# Patient Record
Sex: Male | Born: 2000 | Hispanic: No | Marital: Single | Smoking: Never smoker
Health system: Southern US, Community
[De-identification: ages and names within clinical notes are randomized; demographics above are authoritative.]

---
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
## 2020-07-20 DIAGNOSIS — L301 Dyshidrosis [pompholyx]: Secondary | ICD-10-CM | POA: Diagnosis not present

## 2021-02-10 ENCOUNTER — Emergency Department (HOSPITAL_BASED_OUTPATIENT_CLINIC_OR_DEPARTMENT_OTHER): Payer: Medicaid Other

## 2021-02-10 ENCOUNTER — Emergency Department (HOSPITAL_BASED_OUTPATIENT_CLINIC_OR_DEPARTMENT_OTHER)
Admission: EM | Admit: 2021-02-10 | Discharge: 2021-02-10 | Disposition: A | Discharge status: Home / Self Care | Payer: Medicaid Other | Attending: Emergency Medicine | Admitting: Emergency Medicine

## 2021-02-10 ENCOUNTER — Other Ambulatory Visit: Payer: Self-pay

## 2021-02-10 ENCOUNTER — Encounter (HOSPITAL_BASED_OUTPATIENT_CLINIC_OR_DEPARTMENT_OTHER): Payer: Self-pay | Admitting: Obstetrics and Gynecology

## 2021-02-10 DIAGNOSIS — R110 Nausea: Secondary | ICD-10-CM | POA: Diagnosis not present

## 2021-02-10 DIAGNOSIS — R1013 Epigastric pain: Secondary | ICD-10-CM | POA: Diagnosis not present

## 2021-02-10 DIAGNOSIS — R1011 Right upper quadrant pain: Secondary | ICD-10-CM | POA: Diagnosis not present

## 2021-02-10 DIAGNOSIS — R945 Abnormal results of liver function studies: Secondary | ICD-10-CM | POA: Insufficient documentation

## 2021-02-10 DIAGNOSIS — R030 Elevated blood-pressure reading, without diagnosis of hypertension: Secondary | ICD-10-CM | POA: Diagnosis not present

## 2021-02-10 DIAGNOSIS — R1033 Periumbilical pain: Secondary | ICD-10-CM | POA: Diagnosis not present

## 2021-02-10 DIAGNOSIS — R109 Unspecified abdominal pain: Secondary | ICD-10-CM | POA: Diagnosis not present

## 2021-02-10 LAB — CBC
HCT: 44.7 % (ref 39.0–52.0)
Hemoglobin: 15.5 g/dL (ref 13.0–17.0)
MCH: 32 pg (ref 26.0–34.0)
MCHC: 34.7 g/dL (ref 30.0–36.0)
MCV: 92.2 fL (ref 80.0–100.0)
Platelets: 266 10*3/uL (ref 150–400)
RBC: 4.85 MIL/uL (ref 4.22–5.81)
RDW: 13.1 % (ref 11.5–15.5)
WBC: 5.8 10*3/uL (ref 4.0–10.5)
nRBC: 0 % (ref 0.0–0.2)

## 2021-02-10 LAB — COMPREHENSIVE METABOLIC PANEL
ALT: 53 U/L — ABNORMAL HIGH (ref 0–44)
AST: 51 U/L — ABNORMAL HIGH (ref 15–41)
Albumin: 5.6 g/dL — ABNORMAL HIGH (ref 3.5–5.0)
Alkaline Phosphatase: 67 U/L (ref 38–126)
Anion gap: 14 (ref 5–15)
BUN: 10 mg/dL (ref 6–20)
CO2: 25 mmol/L (ref 22–32)
Calcium: 10.2 mg/dL (ref 8.9–10.3)
Chloride: 103 mmol/L (ref 98–111)
Creatinine, Ser: 0.85 mg/dL (ref 0.61–1.24)
GFR, Estimated: >60 mL/min (ref >60)
Glucose, Bld: 94 mg/dL (ref 70–99)
Potassium: 3.5 mmol/L (ref 3.5–5.1)
Sodium: 142 mmol/L (ref 135–145)
Total Bilirubin: 0.7 mg/dL (ref 0.3–1.2)
Total Protein: 8.3 g/dL — ABNORMAL HIGH (ref 6.5–8.1)

## 2021-02-10 LAB — LIPASE, BLOOD: Lipase: 47 U/L (ref 11–51)

## 2021-02-10 MED ORDER — FAMOTIDINE IN NACL 20-0.9 MG/50ML-% IV SOLN
20.0000 mg | Freq: Once | INTRAVENOUS | Status: AC
  Administered 2021-02-10: 20 mg via INTRAVENOUS
  Filled 2021-02-10: qty 50

## 2021-02-10 MED ORDER — OMEPRAZOLE 20 MG PO CPDR: 20.0000 mg | DELAYED_RELEASE_CAPSULE | Freq: Every day | ORAL | 0 refills | Status: AC

## 2021-02-10 MED ORDER — LIDOCAINE VISCOUS HCL 2 % MT SOLN
15.0000 mL | Freq: Once | OROMUCOSAL | Status: AC
  Administered 2021-02-10: 15 mL via ORAL
  Filled 2021-02-10: qty 15

## 2021-02-10 MED ORDER — SODIUM CHLORIDE 0.9 % IV BOLUS
1000.0000 mL | Freq: Once | INTRAVENOUS | Status: AC
  Administered 2021-02-10: 1000 mL via INTRAVENOUS

## 2021-02-10 MED ORDER — ALUM & MAG HYDROXIDE-SIMETH 200-200-20 MG/5ML PO SUSP
30.0000 mL | Freq: Once | ORAL | Status: AC
  Administered 2021-02-10: 30 mL via ORAL
  Filled 2021-02-10: qty 30

## 2021-02-10 NOTE — ED Provider Notes (Signed)
[facility] EMERGENCY DEPT Provider Note   CSN: [id number] Arrival date & time: 02/10/21  1306     History Chief Complaint  Patient presents with  . Abdominal Pain    Jeremy Porter is a 20 y.o. male.  HPI   20 year old male with past medical history of appendectomy resents the emergency department concern for acute on chronic mid abdominal pain.  Patient states the past couple months he has suffered with GERD-like symptoms.  Usually starts in the mid abdomen and radiates up to his epigastric area.  He states usually the symptoms self resolve however today the symptoms seem more persistent.  He has associated nausea but denies any vomiting or diarrhea.  Denies any fever.  No genitourinary symptoms.  History reviewed. No pertinent past medical history.  Patient Active Problem List   Diagnosis Date Noted  . Failed vision screen 02/17/2017  . Psychosocial stressors 02/17/2017    Past Surgical History:  Procedure Laterality Date  . APPENDECTOMY  12/2012  . LAPAROSCOPIC APPENDECTOMY N/A 01/03/2013   Procedure: APPENDECTOMY LAPAROSCOPIC;  Surgeon: Judie Petit. Leonia Corona, MD;  Location: [facility] OR;  Service: Pediatrics;  Laterality: N/A;       Family History  Problem Relation Age of Onset  . Cancer Paternal Grandmother   . Autism Sister   . Asthma Brother     Social History   Tobacco Use  . Smoking status: Never Smoker  . Smokeless tobacco: Never Used  Vaping Use  . Vaping Use: Never used  Substance Use Topics  . Alcohol use: Yes    Alcohol/week: 30.0 standard drinks    Types: 30 Cans of beer per week    Comment: 30  . Drug use: Yes    Frequency: 14.0 times per week    Types: Marijuana    Home Medications Prior to Admission medications   Not on File    Allergies    Patient has no known allergies.  Review of Systems   Review of Systems  Constitutional: Negative for chills and fever.  HENT: Negative for congestion.   Eyes: Negative for visual  disturbance.  Respiratory: Negative for shortness of breath.   Cardiovascular: Negative for chest pain.  Gastrointestinal: Positive for abdominal pain and nausea. Negative for diarrhea and vomiting.  Genitourinary: Negative for dysuria.  Skin: Negative for rash.  Neurological: Negative for headaches.    Physical Exam Updated Vital Signs BP (!) 165/87   Pulse (!) 112   Temp 98.4 F (36.9 C) (Oral)   Resp (!) 22   SpO2 100%   Physical Exam Vitals and nursing note reviewed.  Constitutional:      Appearance: Normal appearance.  HENT:     Head: Normocephalic.     Mouth/Throat:     Mouth: Mucous membranes are moist.  Cardiovascular:     Rate and Rhythm: Normal rate.  Pulmonary:     Effort: Pulmonary effort is normal. No respiratory distress.  Abdominal:     Palpations: Abdomen is soft.     Tenderness: There is abdominal tenderness in the epigastric area and periumbilical area. There is no guarding. Negative signs include Murphy's sign.  Skin:    General: Skin is warm.  Neurological:     Mental Status: He is alert and oriented to person, place, and time. Mental status is at baseline.  Psychiatric:        Mood and Affect: Mood normal.     ED Results / Procedures / Treatments   Labs (all labs ordered  are listed, but only abnormal results are displayed) Labs Reviewed  CBC  LIPASE, BLOOD  COMPREHENSIVE METABOLIC PANEL    EKG None  Radiology No results found.  Procedures Procedures   Medications Ordered in ED Medications  sodium chloride 0.9 % bolus 1,000 mL (has no administration in time range)  famotidine (PEPCID) IVPB 20 mg premix (has no administration in time range)    ED Course  I have reviewed the triage vital signs and the nursing notes.  Pertinent labs & imaging results that were available during my care of the patient were reviewed by me and considered in my medical decision making (see chart for details).    MDM Rules/Calculators/A&P                           21 year old male presents the emergency department for mid abdominal pain associated with nausea.  Patient is hypertensive and tachycardic on arrival, mildly tender abdomen.  Blood work shows elevated liver function test, normal bilirubin.  After fluids and medication patient feels better, tachycardia has resolved.  There was concern for alcohol withdrawal aspect.  Patient admits that he has drank daily for the past 3 weeks, approximately 5-6 beers a day.  He has not had any alcohol today but does not feel like he is withdrawing.  He is requesting more medication for reflux symptoms, will give a GI cocktail.  Ultrasound of the gallbladder is pending.  Final Clinical Impression(s) / ED Diagnoses Final diagnoses:  None    Rx / DC Orders ED Discharge Orders    None       Rozelle Logan, DO 02/12/21 5643

## 2021-02-10 NOTE — ED Provider Notes (Signed)
Patient signed out to me is pending results of ultrasound which are unremarkable.  No evidence of cholelithiasis or other pathology noted.  Patient given a prescription of Prilosec, advised follow-up with his primary care doctor within the week.  Advised immediate return if he has worsening pain fevers or any additional concerns.    Cheryll Cockayne, MD 02/10/21 1535

## 2021-02-10 NOTE — ED Triage Notes (Signed)
Patient reports to the ER for abdominal pain. Patient reports he has previously had GERD symptoms. Patient reports that the symptoms had gone away and now has returned and not going away. Patient reports mid thoracic abdominal pain.

## 2021-02-10 NOTE — Discharge Instructions (Addendum)
Call your primary care doctor or specialist as discussed in the next 2-3 days.   Return immediately back to the ER if:  Your symptoms worsen within the next 12-24 hours. You develop new symptoms such as new fevers, persistent vomiting, new pain, shortness of breath, or new weakness or numbness, or if you have any other concerns.  

## 2021-02-26 DIAGNOSIS — Z131 Encounter for screening for diabetes mellitus: Secondary | ICD-10-CM | POA: Diagnosis not present

## 2021-02-26 DIAGNOSIS — Z1159 Encounter for screening for other viral diseases: Secondary | ICD-10-CM | POA: Diagnosis not present

## 2021-02-26 DIAGNOSIS — Z7251 High risk heterosexual behavior: Secondary | ICD-10-CM | POA: Diagnosis not present

## 2021-02-26 DIAGNOSIS — Z13220 Encounter for screening for lipoid disorders: Secondary | ICD-10-CM | POA: Diagnosis not present

## 2021-02-26 DIAGNOSIS — Z0000 Encounter for general adult medical examination without abnormal findings: Secondary | ICD-10-CM | POA: Diagnosis not present

## 2021-02-26 DIAGNOSIS — Z114 Encounter for screening for human immunodeficiency virus [HIV]: Secondary | ICD-10-CM | POA: Diagnosis not present

## 2021-04-29 ENCOUNTER — Emergency Department (HOSPITAL_BASED_OUTPATIENT_CLINIC_OR_DEPARTMENT_OTHER)
Admission: EM | Admit: 2021-04-29 | Discharge: 2021-04-29 | Disposition: A | Discharge status: Home / Self Care | Payer: Medicaid Other | Attending: Emergency Medicine | Admitting: Emergency Medicine

## 2021-04-29 ENCOUNTER — Encounter (HOSPITAL_BASED_OUTPATIENT_CLINIC_OR_DEPARTMENT_OTHER): Payer: Self-pay

## 2021-04-29 ENCOUNTER — Other Ambulatory Visit: Payer: Self-pay

## 2021-04-29 DIAGNOSIS — R1013 Epigastric pain: Secondary | ICD-10-CM | POA: Insufficient documentation

## 2021-04-29 DIAGNOSIS — K5900 Constipation, unspecified: Secondary | ICD-10-CM | POA: Insufficient documentation

## 2021-04-29 DIAGNOSIS — K625 Hemorrhage of anus and rectum: Secondary | ICD-10-CM | POA: Insufficient documentation

## 2021-04-29 LAB — CBC WITH DIFFERENTIAL/PLATELET
Abs Immature Granulocytes: 0.01 10*3/uL (ref 0.00–0.07)
Basophils Absolute: 0 10*3/uL (ref 0.0–0.1)
Basophils Relative: 1 %
Eosinophils Absolute: 0.4 10*3/uL (ref 0.0–0.5)
Eosinophils Relative: 6 %
HCT: 37.3 % — ABNORMAL LOW (ref 39.0–52.0)
Hemoglobin: 12.9 g/dL — ABNORMAL LOW (ref 13.0–17.0)
Immature Granulocytes: 0 %
Lymphocytes Relative: 38 %
Lymphs Abs: 2.5 10*3/uL (ref 0.7–4.0)
MCH: 32.2 pg (ref 26.0–34.0)
MCHC: 34.6 g/dL (ref 30.0–36.0)
MCV: 93 fL (ref 80.0–100.0)
Monocytes Absolute: 0.5 10*3/uL (ref 0.1–1.0)
Monocytes Relative: 7 %
Neutro Abs: 3.1 10*3/uL (ref 1.7–7.7)
Neutrophils Relative %: 48 %
Platelets: 263 10*3/uL (ref 150–400)
RBC: 4.01 MIL/uL — ABNORMAL LOW (ref 4.22–5.81)
RDW: 12.8 % (ref 11.5–15.5)
WBC: 6.4 10*3/uL (ref 4.0–10.5)
nRBC: 0 % (ref 0.0–0.2)

## 2021-04-29 LAB — COMPREHENSIVE METABOLIC PANEL
ALT: 21 U/L (ref 0–44)
AST: 21 U/L (ref 15–41)
Albumin: 4.5 g/dL (ref 3.5–5.0)
Alkaline Phosphatase: 37 U/L — ABNORMAL LOW (ref 38–126)
Anion gap: 11 (ref 5–15)
BUN: 9 mg/dL (ref 6–20)
CO2: 24 mmol/L (ref 22–32)
Calcium: 9.4 mg/dL (ref 8.9–10.3)
Chloride: 105 mmol/L (ref 98–111)
Creatinine, Ser: 0.9 mg/dL (ref 0.61–1.24)
GFR, Estimated: >60 mL/min (ref >60)
Glucose, Bld: 94 mg/dL (ref 70–99)
Potassium: 3.3 mmol/L — ABNORMAL LOW (ref 3.5–5.1)
Sodium: 140 mmol/L (ref 135–145)
Total Bilirubin: 0.6 mg/dL (ref 0.3–1.2)
Total Protein: 6.3 g/dL — ABNORMAL LOW (ref 6.5–8.1)

## 2021-04-29 LAB — LIPASE, BLOOD: Lipase: 55 U/L — ABNORMAL HIGH (ref 11–51)

## 2021-04-29 MED ORDER — OMEPRAZOLE 20 MG PO CPDR: 20.0000 mg | DELAYED_RELEASE_CAPSULE | Freq: Every day | ORAL | 0 refills | Status: AC

## 2021-04-29 MED ORDER — LIDOCAINE VISCOUS HCL 2 % MT SOLN
15.0000 mL | Freq: Once | OROMUCOSAL | Status: AC
  Administered 2021-04-29: 15 mL via ORAL
  Filled 2021-04-29: qty 15

## 2021-04-29 MED ORDER — ALUM & MAG HYDROXIDE-SIMETH 200-200-20 MG/5ML PO SUSP
30.0000 mL | Freq: Once | ORAL | Status: AC
  Administered 2021-04-29: 30 mL via ORAL
  Filled 2021-04-29: qty 30

## 2021-04-29 MED ORDER — DOCUSATE SODIUM 100 MG PO CAPS: 100.0000 mg | ORAL_CAPSULE | Freq: Two times a day (BID) | ORAL | 0 refills | Status: AC

## 2021-04-29 NOTE — ED Provider Notes (Signed)
[facility] EMERGENCY DEPT Provider Note   CSN: [id number] Arrival date & time: 04/29/21  8185     History Chief Complaint  Patient presents with   Abdominal Pain   Rectal Bleeding    Jeremy Porter is a 20 y.o. male.  HPI     This is a 20 year old male who presents with abdominal pain and rectal bleeding.  Patient reports that he had a bowel movement and noted blood in his stool.  He cannot quantify the amount but states "it seemed like a lot."  He reports constipation and hard stools.  He has not noted any hemorrhoids.  He states that he has intermittent issues with epigastric abdominal pain.  He was seen and evaluated previously and diagnosed with reflux.  He states occasionally the pains are sharp.  Rates his pain currently at 5 out of 10.  Denies significant alcohol or anti-inflammatory use.  No history of rectal bleeding in the past.  History reviewed. No pertinent past medical history.  Patient Active Problem List   Diagnosis Date Noted   Failed vision screen 02/17/2017   Psychosocial stressors 02/17/2017    Past Surgical History:  Procedure Laterality Date   APPENDECTOMY  12/2012   LAPAROSCOPIC APPENDECTOMY N/A 01/03/2013   Procedure: APPENDECTOMY LAPAROSCOPIC;  Surgeon: Judie Petit. Leonia Corona, MD;  Location: [facility] OR;  Service: Pediatrics;  Laterality: N/A;       Family History  Problem Relation Age of Onset   Cancer Paternal Grandmother    Autism Sister    Asthma Brother     Social History   Tobacco Use   Smoking status: Never   Smokeless tobacco: Never  Vaping Use   Vaping Use: Never used  Substance Use Topics   Alcohol use: Yes    Alcohol/week: 30.0 standard drinks    Types: 30 Cans of beer per week    Comment: 30   Drug use: Yes    Frequency: 14.0 times per week    Types: Marijuana    Home Medications Prior to Admission medications   Medication Sig Start Date End Date Taking? Authorizing Provider  docusate sodium (COLACE) 100 MG  capsule Take 1 capsule (100 mg total) by mouth every 12 (twelve) hours. 04/29/21  Yes Azalia Neuberger, Mayer Masker, MD  omeprazole (PRILOSEC) 20 MG capsule Take 1 capsule (20 mg total) by mouth daily. 04/29/21  Yes Jordi Kamm, Mayer Masker, MD  omeprazole (PRILOSEC) 20 MG capsule Take 1 capsule (20 mg total) by mouth daily for 20 days. 02/10/21 03/02/21  Cheryll Cockayne, MD    Allergies    Patient has no known allergies.  Review of Systems   Review of Systems  Constitutional:  Negative for fever.  Respiratory:  Negative for shortness of breath.   Cardiovascular:  Negative for chest pain.  Gastrointestinal:  Positive for abdominal pain, blood in stool and constipation. Negative for nausea and vomiting.  Genitourinary:  Negative for dysuria.  All other systems reviewed and are negative.  Physical Exam Updated Vital Signs BP 132/85   Pulse 60   Temp 98.1 F (36.7 C) (Oral)   Resp 18   Ht 1.829 m (6')   Wt 72.6 kg   SpO2 99%   BMI 21.70 kg/m   Physical Exam Vitals and nursing note reviewed.  Constitutional:      Appearance: He is well-developed. He is not ill-appearing.  HENT:     Head: Normocephalic and atraumatic.  Eyes:     Conjunctiva/sclera: Conjunctivae normal.  Cardiovascular:  Rate and Rhythm: Normal rate and regular rhythm.     Heart sounds: No murmur heard. Pulmonary:     Effort: Pulmonary effort is normal. No respiratory distress.     Breath sounds: Normal breath sounds.  Abdominal:     Palpations: Abdomen is soft.     Tenderness: There is abdominal tenderness in the epigastric area. There is no guarding or rebound.  Genitourinary:    Comments: External rectal exam without obvious mass or hemorrhoid, patient deferred internal exam, there is no gross blood on external visual inspection Musculoskeletal:     Cervical back: Neck supple.  Skin:    General: Skin is warm and dry.  Neurological:     Mental Status: He is alert.  Psychiatric:        Mood and Affect: Mood normal.     ED Results / Procedures / Treatments   Labs (all labs ordered are listed, but only abnormal results are displayed) Labs Reviewed  CBC WITH DIFFERENTIAL/PLATELET - Abnormal; Notable for the following components:      Result Value   RBC 4.01 (*)    Hemoglobin 12.9 (*)    HCT 37.3 (*)    All other components within normal limits  COMPREHENSIVE METABOLIC PANEL - Abnormal; Notable for the following components:   Potassium 3.3 (*)    Total Protein 6.3 (*)    Alkaline Phosphatase 37 (*)    All other components within normal limits  LIPASE, BLOOD - Abnormal; Notable for the following components:   Lipase 55 (*)    All other components within normal limits    EKG None  Radiology No results found.  Procedures Procedures   Medications Ordered in ED Medications  alum & mag hydroxide-simeth (MAALOX/MYLANTA) 200-200-20 MG/5ML suspension 30 mL (30 mLs Oral Given 04/29/21 0326)    And  lidocaine (XYLOCAINE) 2 % viscous mouth solution 15 mL (15 mLs Oral Given 04/29/21 0326)    ED Course  I have reviewed the triage vital signs and the nursing notes.  Pertinent labs & imaging results that were available during my care of the patient were reviewed by me and considered in my medical decision making (see chart for details).    MDM Rules/Calculators/A&P                          Patient presents with blood in the stool.  Also reports intermittent abdominal pain.  He is overall nontoxic and vital signs are reassuring.  His abdominal exam is fairly benign.  He has some mild epigastric tenderness.  Question peptic ulcer versus gastritis versus reflux.  Patient also reports hard stools.  On external visualization, there is no obvious hemorrhoids or active bleeding.  Patient deferred internal exam.  Patient was given GI cocktail.  Labs obtained.  Hemoglobin 12.9.  Baseline around 15.  Other lab work-up is largely reassuring.  Patient did not have any recurrent rectal bleeding in the emergency  department.  On recheck, he states he feels much better after GI cocktail.  Difficult to fully assess extent of bleeding although patient does not appear to be unstable or actively bleeding at this time.  Patient could have an internal hemorrhoid or blood could be related to constipation.  Related to his abdominal pain, this is epigastric.  Highly suspect ulcer versus gastritis.  Recommend abstain from alcohol and NSAIDs and reinitiating omeprazole.  Patient stated understanding.  Also recommend Colace and increasing fluids in his diet to  address constipation  After history, exam, and medical workup I feel the patient has been appropriately medically screened and is safe for discharge home. Pertinent diagnoses were discussed with the patient. Patient was given return precautions.  Final Clinical Impression(s) / ED Diagnoses Final diagnoses:  Epigastric pain  Rectal bleeding    Rx / DC Orders ED Discharge Orders          Ordered    omeprazole (PRILOSEC) 20 MG capsule  Daily        04/29/21 0425    docusate sodium (COLACE) 100 MG capsule  Every 12 hours        04/29/21 0425             Shon Baton, MD 04/29/21 [phone]

## 2021-04-29 NOTE — Discharge Instructions (Addendum)
You were seen today for abdominal pain and rectal bleeding.  This could be related to some reflux or ulcers.  Avoid alcohol or anti-inflammatory medications.  Take omeprazole daily.  Given your hard stools, start Colace daily and increase water in her diet.  Sometimes blood can be related to constipation.  Follow-up with your primary doctor.  If you note recurrent blood or large volume blood, you need to be reevaluated.

## 2021-04-29 NOTE — ED Triage Notes (Signed)
Pt is present to the ED for generalized lower abd pain that started yesterday and about one hour PTA patient noticed bright, red blood in his stool. Per patient it was a moderate amount of blood. Pt states he has not had that happen before and denies any trauma. Denies N/V/D.

## 2021-04-30 ENCOUNTER — Telehealth: Payer: Self-pay

## 2021-04-30 NOTE — Telephone Encounter (Signed)
Transition Care Management Unsuccessful Follow-up Telephone Call  Date of discharge and from where:  04/29/2021 from [facility]  Attempts:  1st Attempt  Reason for unsuccessful TCM follow-up call:  Unable to leave message

## 2021-05-02 NOTE — Telephone Encounter (Signed)
Transition Care Management Unsuccessful Follow-up Telephone Call  Date of discharge and from where:  04/29/2021 from [facility]  Attempts:  2nd Attempt  Reason for unsuccessful TCM follow-up call:  No answer/busy

## 2021-05-03 NOTE — Telephone Encounter (Signed)
Transition Care Management Unsuccessful Follow-up Telephone Call  Date of discharge and from where:  04/29/2021 from [facility]  Attempts:  3rd Attempt  Reason for unsuccessful TCM follow-up call:  Unable to reach patient

## 2021-06-08 DIAGNOSIS — L301 Dyshidrosis [pompholyx]: Secondary | ICD-10-CM | POA: Diagnosis not present

## 2021-06-08 DIAGNOSIS — B353 Tinea pedis: Secondary | ICD-10-CM | POA: Diagnosis not present

## 2022-04-13 IMAGING — US US ABDOMEN LIMITED RUQ/ASCITES
1 series · 14 of 25 positions shown · non-contrast
Comparison: None.

CLINICAL DATA: Diffuse abdominal pain and nausea 2-3 months.
Elevated LFTs.

EXAM:
ULTRASOUND ABDOMEN LIMITED RIGHT UPPER QUADRANT

[Series 1: us abdomen limited ruq (liver/gb) · 14 of 94 slices shown]
[im 1/94]
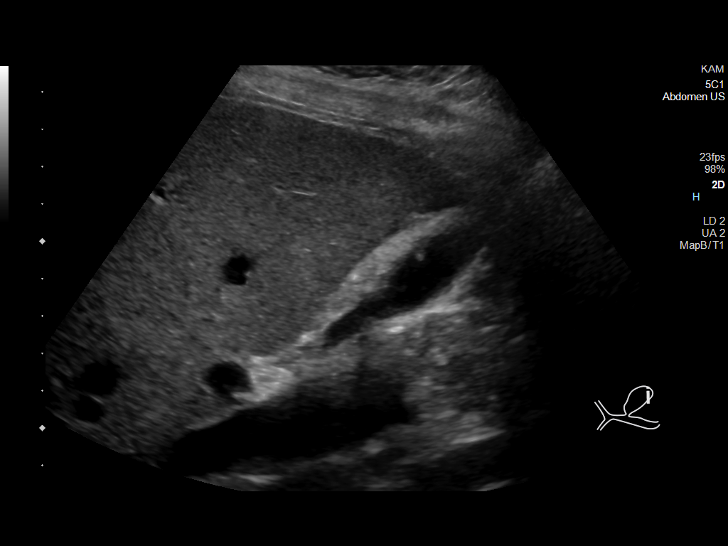
[im 8/94]
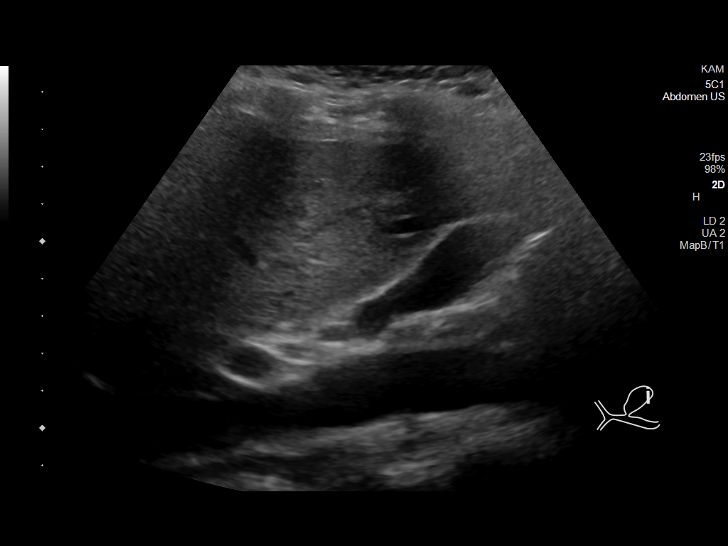
[im 16/94]
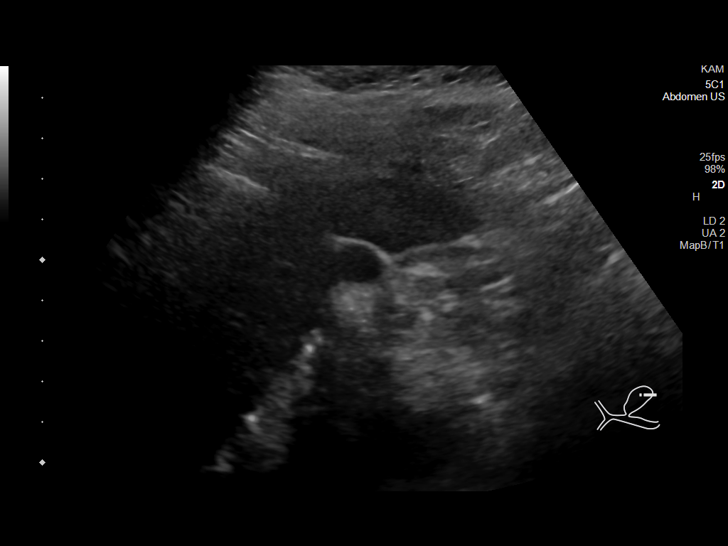
[im 24/94]
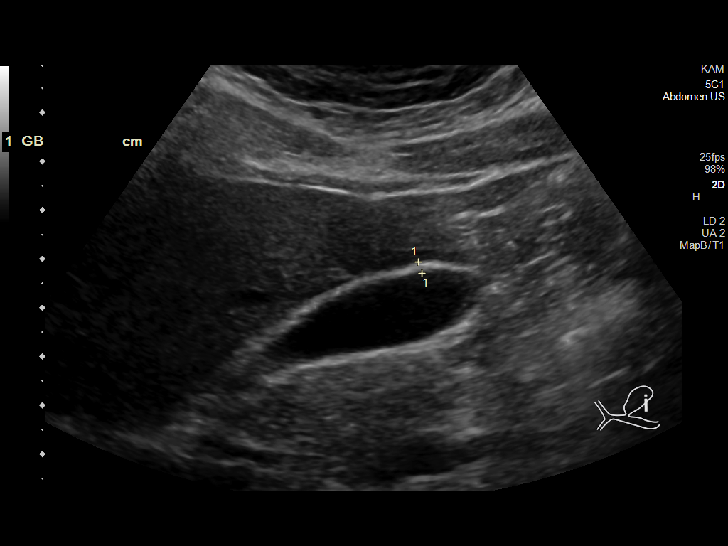
[im 32/94]
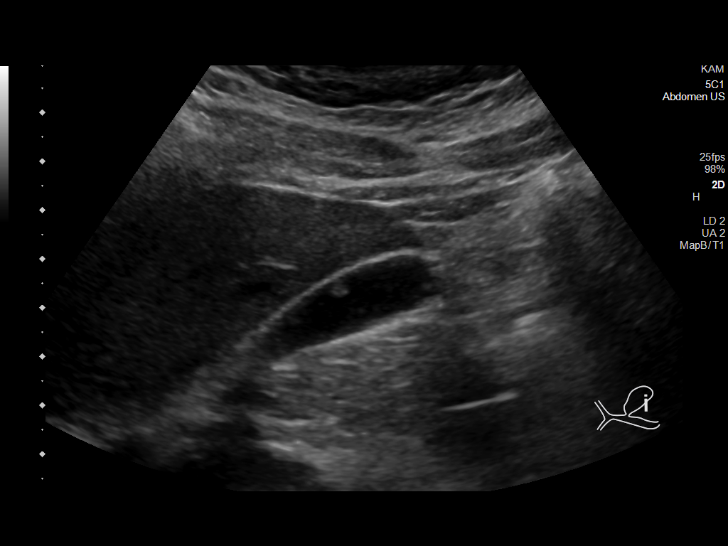
[im 35/94]
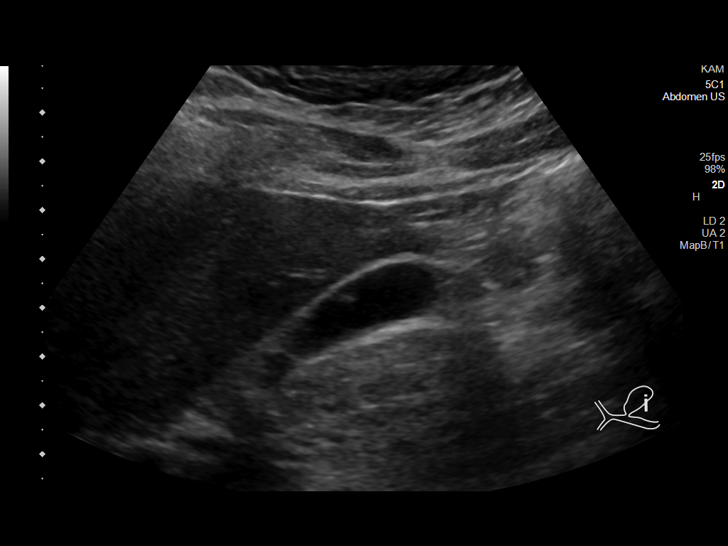
[im 43/94]
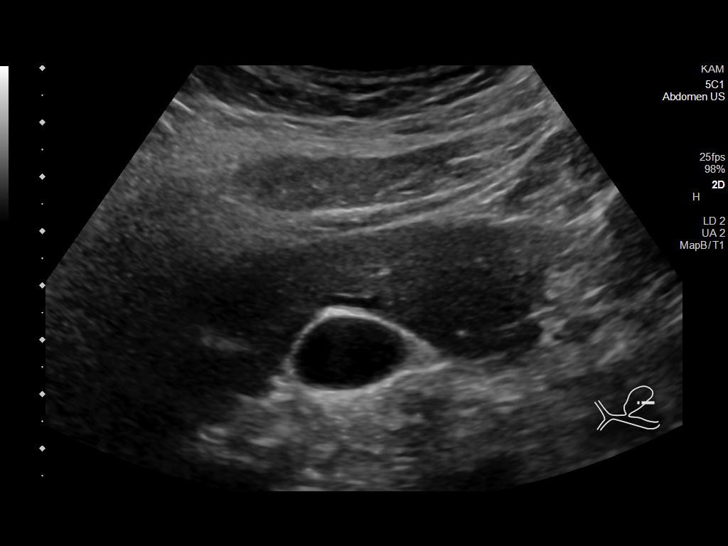
[im 51/94]
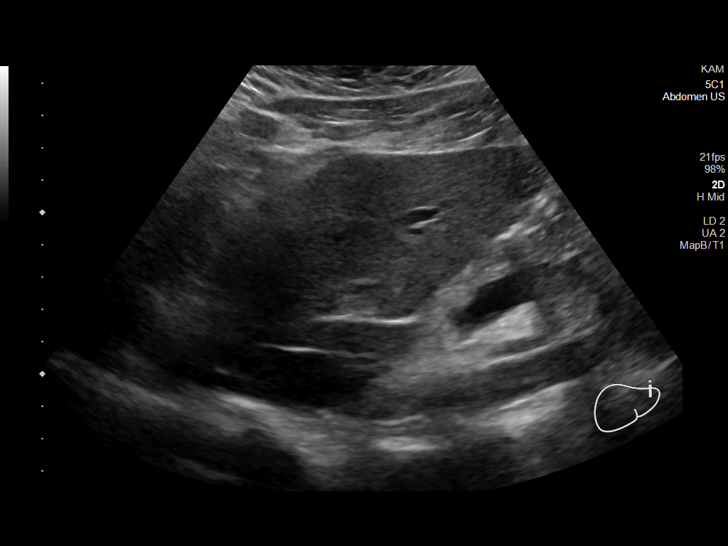
[im 59/94]
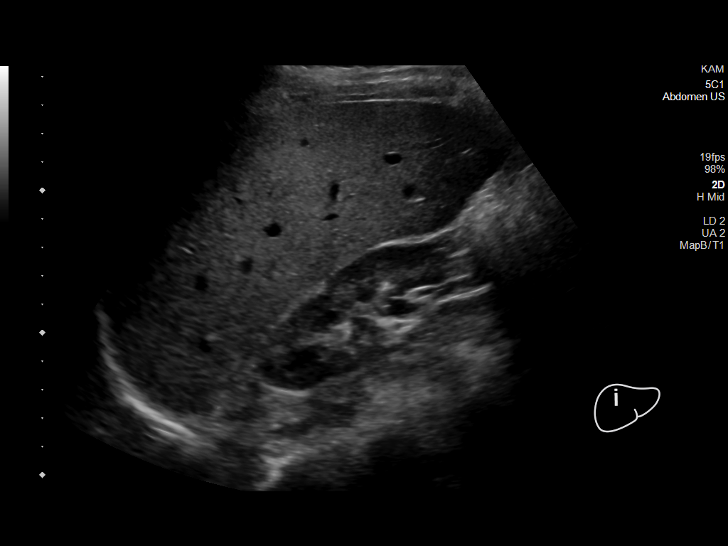
[im 63/94]
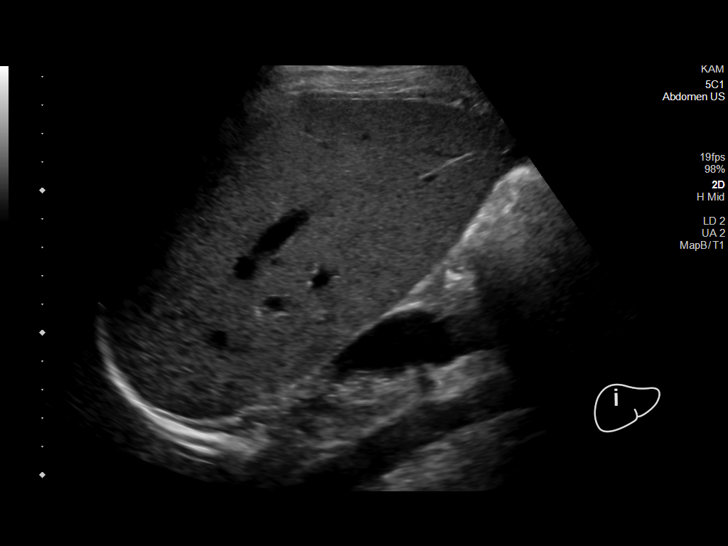
[im 70/94]
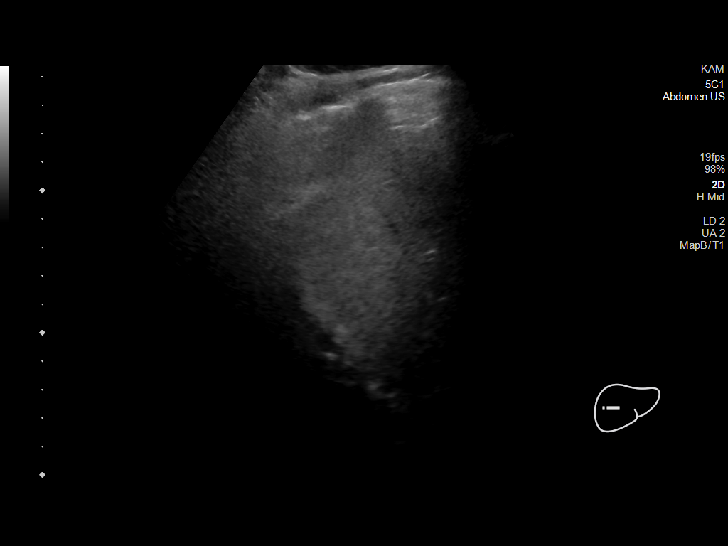
[im 78/94]
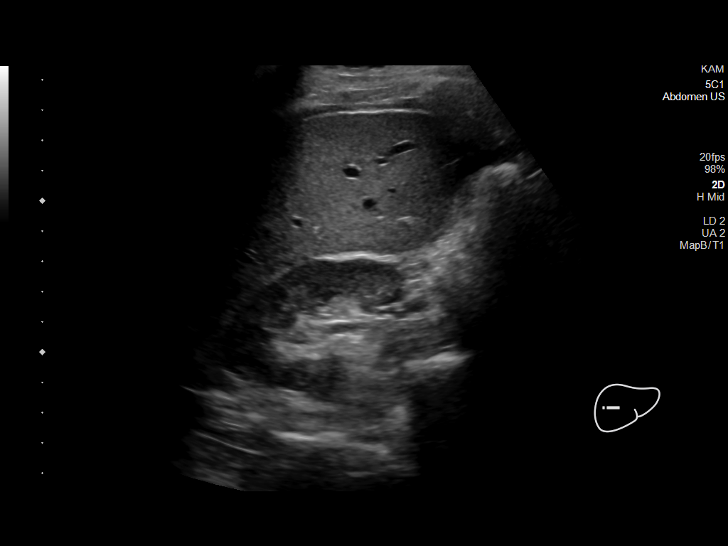
[im 86/94]
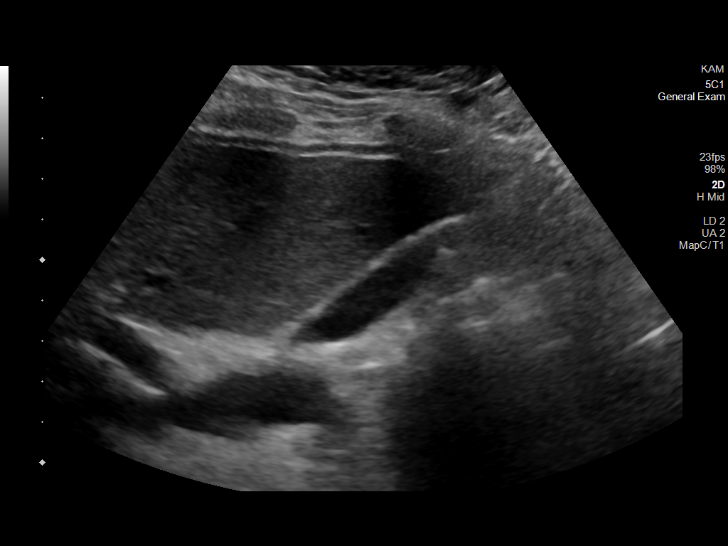
[im 94/94]
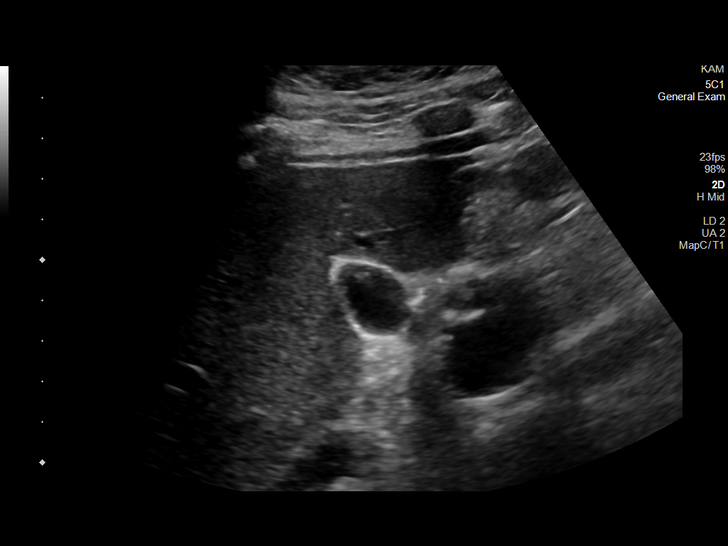

[14 of 25 positions shown; findings below may reference images not displayed]

FINDINGS: Gallbladder:

No gallstones or wall thickening visualized. No sonographic Murphy
sign noted by sonographer. Single nonmobile echogenic focus adherent
to the nondependent gallbladder wall measuring 6 mm likely a polyp.

Common bile duct:

Diameter: 4.5 mm.

Liver:

No focal lesion identified. Within normal limits in parenchymal
echogenicity. Portal vein is patent on color Doppler imaging with
normal direction of blood flow towards the liver.

Other: None.
IMPRESSION: No evidence of cholelithiasis and no acute findings.

## 2023-03-05 ENCOUNTER — Telehealth: Payer: Self-pay

## 2023-03-05 NOTE — Telephone Encounter (Signed)
Attempted to call patient-no working #. AS, CMA
